# Patient Record
Sex: Female | Born: 1990 | Hispanic: Yes | Marital: Married | Smoking: Never smoker
Health system: Southern US, Community
[De-identification: ages and names within clinical notes are randomized; demographics above are authoritative.]

## PROBLEM LIST (undated history)

## (undated) DIAGNOSIS — D649 Anemia, unspecified: Secondary | ICD-10-CM

## (undated) HISTORY — PX: NO PAST SURGERIES: SHX2092

---

## 2011-10-12 NOTE — L&D Delivery Note (Signed)
     Julia Casey, Julia Casey [[id number]]  Delivery Note At 10:09 AM a non-viable female was delivered via Vaginal, Spontaneous Delivery (Presentation: Breech).  APGAR: 0, 0;  Placenta status: Delivered spontaneously.  Cord: 3 vessel.    Anesthesia: None  Episiotomy: None Lacerations: None Suture Repair: N/A Est. Blood Loss (mL): 600 ml   Mom to postpartum.  S/O and family members at bedside supporting patient.  Pt responding appropriately to loss.  LEFTWICH-KIRBY, Jayko Voorhees 11/11/2011, 12:16 PM     Julia Casey, Julia Casey [[id number]]  Delivery Note At 10:12 AM a non-viable female was delivered via Vaginal, Spontaneous Delivery (Presentation: Breech).  APGAR: 0, 0; Placenta status: delivered spontaneously.  Cord:  3 vessel.  Anesthesia: None  Episiotomy: None Lacerations: None Suture Repair: n/a Est. Blood Loss (mL): see above  Mom to postpartum.  S/O and family members at bedside supporting patient.  Pt responding appropriately to loss.  Dr Aviva Signs present for delivery of both infants.  LEFTWICH-KIRBY, Chevelle Durr 11/11/2011, 12:16 PM

## 2011-10-12 NOTE — L&D Delivery Note (Signed)
Delivery as documented.

## 2011-11-10 ENCOUNTER — Inpatient Hospital Stay (HOSPITAL_COMMUNITY)
Admission: AD | Admit: 2011-11-10 | Discharge: 2011-11-12 | DRG: 779 | Disposition: A | Discharge status: Home / Self Care | Payer: Medicaid - Out of State | Source: Ambulatory Visit | Attending: Obstetrics and Gynecology | Admitting: Obstetrics and Gynecology

## 2011-11-10 ENCOUNTER — Encounter (HOSPITAL_COMMUNITY): Payer: Self-pay | Admitting: *Deleted

## 2011-11-10 DIAGNOSIS — A749 Chlamydial infection, unspecified: Secondary | ICD-10-CM | POA: Diagnosis present

## 2011-11-10 DIAGNOSIS — O030 Genital tract and pelvic infection following incomplete spontaneous abortion: Principal | ICD-10-CM | POA: Diagnosis present

## 2011-11-10 DIAGNOSIS — IMO0002 Reserved for concepts with insufficient information to code with codable children: Secondary | ICD-10-CM | POA: Diagnosis present

## 2011-11-10 DIAGNOSIS — O3430 Maternal care for cervical incompetence, unspecified trimester: Secondary | ICD-10-CM | POA: Diagnosis present

## 2011-11-10 DIAGNOSIS — O30009 Twin pregnancy, unspecified number of placenta and unspecified number of amniotic sacs, unspecified trimester: Secondary | ICD-10-CM | POA: Diagnosis present

## 2011-11-10 HISTORY — DX: Anemia, unspecified: D64.9

## 2011-11-10 LAB — CBC
MCH: 23.1 pg — ABNORMAL LOW (ref 26.0–34.0)
MCHC: 31.9 g/dL (ref 30.0–36.0)
MCV: 72.3 fL — ABNORMAL LOW (ref 78.0–100.0)
Platelets: 253 10*3/uL (ref 150–400)
RDW: 18.1 % — ABNORMAL HIGH (ref 11.5–15.5)

## 2011-11-10 LAB — DIFFERENTIAL
Basophils Absolute: 0.1 10*3/uL (ref 0.0–0.1)
Basophils Relative: 0 % (ref 0–1)
Eosinophils Absolute: 0.2 10*3/uL (ref 0.0–0.7)
Eosinophils Relative: 2 % (ref 0–5)
Neutrophils Relative %: 65 % (ref 43–77)

## 2011-11-10 LAB — STREP B DNA PROBE

## 2011-11-10 NOTE — Progress Notes (Signed)
[name] found to have no FHT's  Via bedside US.

## 2011-11-10 NOTE — Progress Notes (Signed)
Pt presents to [facility] via ems.  Bulging membranes through vagina.  Pt states she started hurting around before calling ems.

## 2011-11-10 NOTE — H&P (Signed)
Julia Casey is a 21 y.o. female presenting for protruding membranes. Maternal Medical History:  Reason for admission: 66yr G3P2002 at estimated 18.4wks with twin gestation arrives via EMS with contractions and protruding membranes.  Pt states mild contractions, irregular, for approximately 1 day. Tonight, the patient was using the restroom at noticed her amniotic sac protruding through her vagina. She denies any fevers, leakage of fluid, discharge  Patient recently moved here from [place] and has limited prenatal care, consisting of an ultrasound suggesting twin gestation. Patient states that her EDD is "sometime in June". She denies any prior complications with this pregnancy.   Contractions: Onset was yesterday.   Frequency: irregular.      OB History    Grav Para Term Preterm Abortions TAB SAB Ect Mult Living   3 2 2  0 0 0 0 0 0 2     Past Medical History  Diagnosis Date  . Anemia    Past Surgical History  Procedure Date  . No past surgeries    Family History: family history is not on file. Social History:  reports that she has never smoked. She does not have any smokeless tobacco history on file. She reports that she uses illicit drugs (Marijuana). She reports that she does not drink alcohol.  Review of Systems  Constitutional: Negative for fever and chills.  Respiratory: Negative for shortness of breath.   Cardiovascular: Negative for chest pain.  Gastrointestinal: Negative for abdominal pain.  All other systems reviewed and are negative.      Blood pressure 121/68, pulse 96, temperature 98.2 F (36.8 C), temperature source Oral, resp. rate 18, height 5\' 3"  (1.6 m), weight 75.297 kg (166 lb). Maternal Exam:  Uterine Assessment: Contraction strength is mild.  Abdomen: Fundal height is 20cm.   Fetal presentation: breech  Introitus: Amniotic sac bulging out of vaginal introitus, intact.      Fetal Exam Fetal Monitor Review: Mode: ultrasound.   Baseline rate:  Fetal Heart tones not present in [name], Present in Baby B.      Physical Exam  Nursing note and vitals reviewed. Constitutional: She is oriented to person, place, and time. She appears well-developed and well-nourished. No distress.  HENT:  Head: Normocephalic and atraumatic.  GI: Soft. There is no tenderness.  Musculoskeletal: She exhibits no edema and no tenderness.  Neurological: She is alert and oriented to person, place, and time. No cranial nerve deficit.  Skin: Skin is warm and dry.  Psychiatric: She has a normal mood and affect. Her behavior is normal. Judgment and thought content normal.       Emotionally distressed     Prenatal labs: ABO, Rh:   Antibody:   Rubella:   RPR:    HBsAg:    HIV:    GBS:     Assessment/Plan: 1) Twin gestation with Fetal Demise and Living Fetus, <20wks  - approximately 18.4wks by US performed   - [name] IUFD, Baby B viable and breech presentation  - Suspect incompetent cervix at this time  - admit to L&D  - contrations increasing, will monitor progress of labor and anticipate augmentation if not progressing  - obtain routine prenatal labs  - routine care and expectant management   Cameron Proud 11/10/2011, 11:19 PM

## 2011-11-10 NOTE — Progress Notes (Signed)
SSE  Done per Leslie Dales, CNM.  Wet prep and cultures collected.  VE done.

## 2011-11-10 NOTE — Progress Notes (Signed)
Dr. Emelda Fear at bedside.  Bedside US done.

## 2011-11-10 NOTE — Progress Notes (Signed)
Pelvic exam done and labs collected after bag that was bulging through vagina appeared to be intact per CNM came out and contained nothing.

## 2011-11-11 ENCOUNTER — Other Ambulatory Visit: Payer: Self-pay | Admitting: Obstetrics and Gynecology

## 2011-11-11 ENCOUNTER — Encounter (HOSPITAL_COMMUNITY): Payer: Self-pay | Admitting: *Deleted

## 2011-11-11 ENCOUNTER — Inpatient Hospital Stay (HOSPITAL_COMMUNITY): Payer: Medicaid - Out of State

## 2011-11-11 DIAGNOSIS — O030 Genital tract and pelvic infection following incomplete spontaneous abortion: Secondary | ICD-10-CM

## 2011-11-11 DIAGNOSIS — O3430 Maternal care for cervical incompetence, unspecified trimester: Secondary | ICD-10-CM

## 2011-11-11 DIAGNOSIS — O30009 Twin pregnancy, unspecified number of placenta and unspecified number of amniotic sacs, unspecified trimester: Secondary | ICD-10-CM

## 2011-11-11 DIAGNOSIS — IMO0002 Reserved for concepts with insufficient information to code with codable children: Secondary | ICD-10-CM | POA: Diagnosis present

## 2011-11-11 LAB — CBC
HCT: 23.9 % — ABNORMAL LOW (ref 36.0–46.0)
HCT: 23.9 % — ABNORMAL LOW (ref 36.0–46.0)
Hemoglobin: 7.6 g/dL — ABNORMAL LOW (ref 12.0–15.0)
Hemoglobin: 8.1 g/dL — ABNORMAL LOW (ref 12.0–15.0)
MCH: 23 pg — ABNORMAL LOW (ref 26.0–34.0)
MCHC: 31.8 g/dL (ref 30.0–36.0)
MCV: 74.5 fL — ABNORMAL LOW (ref 78.0–100.0)
RDW: 17.1 % — ABNORMAL HIGH (ref 11.5–15.5)
WBC: 18 10*3/uL — ABNORMAL HIGH (ref 4.0–10.5)

## 2011-11-11 LAB — RUBELLA ANTIBODY, IGM: Rubella: IMMUNE

## 2011-11-11 LAB — GC/CHLAMYDIA PROBE AMP, GENITAL
Chlamydia, DNA Probe: POSITIVE — AB
GC Probe Amp, Genital: NEGATIVE

## 2011-11-11 LAB — RUBELLA SCREEN: Rubella: 24.7 IU/mL — ABNORMAL HIGH

## 2011-11-11 LAB — WET PREP, GENITAL
WBC, Wet Prep HPF POC: NONE SEEN
Yeast Wet Prep HPF POC: NONE SEEN

## 2011-11-11 LAB — HEPATITIS B SURFACE ANTIGEN: Hepatitis B Surface Ag: NEGATIVE

## 2011-11-11 LAB — RAPID HIV SCREEN (WH-MAU): Rapid HIV Screen: NONREACTIVE

## 2011-11-11 LAB — RAPID URINE DRUG SCREEN, HOSP PERFORMED
Opiates: NOT DETECTED
Tetrahydrocannabinol: NOT DETECTED

## 2011-11-11 LAB — ABO/RH: RH Type: POSITIVE

## 2011-11-11 MED ORDER — MISOPROSTOL 200 MCG PO TABS
400.0000 ug | ORAL_TABLET | Freq: Once | ORAL | Status: AC
  Administered 2011-11-11: 400 ug via VAGINAL
  Filled 2011-11-11: qty 2

## 2011-11-11 MED ORDER — MISOPROSTOL 200 MCG PO TABS
800.0000 ug | ORAL_TABLET | Freq: Once | ORAL | Status: AC
  Administered 2011-11-11: 800 ug via RECTAL

## 2011-11-11 MED ORDER — LACTATED RINGERS IV BOLUS (SEPSIS)
1000.0000 mL | Freq: Once | INTRAVENOUS | Status: AC
  Administered 2011-11-11: 1000 mL via INTRAVENOUS

## 2011-11-11 MED ORDER — IBUPROFEN 600 MG PO TABS
600.0000 mg | ORAL_TABLET | Freq: Four times a day (QID) | ORAL | Status: DC
  Administered 2011-11-11 – 2011-11-12 (×4): 600 mg via ORAL
  Filled 2011-11-11 (×4): qty 1

## 2011-11-11 MED ORDER — SIMETHICONE 80 MG PO CHEW: 80.0000 mg | CHEWABLE_TABLET | ORAL | Status: DC | PRN

## 2011-11-11 MED ORDER — ONDANSETRON HCL 4 MG/2ML IJ SOLN: 4.0000 mg | Freq: Four times a day (QID) | INTRAMUSCULAR | Status: DC | PRN

## 2011-11-11 MED ORDER — ONDANSETRON HCL 4 MG PO TABS: 4.0000 mg | ORAL_TABLET | ORAL | Status: DC | PRN

## 2011-11-11 MED ORDER — WITCH HAZEL-GLYCERIN EX PADS: 1.0000 "application " | MEDICATED_PAD | CUTANEOUS | Status: DC | PRN

## 2011-11-11 MED ORDER — BENZOCAINE-MENTHOL 20-0.5 % EX AERO: 1.0000 "application " | INHALATION_SPRAY | CUTANEOUS | Status: DC | PRN

## 2011-11-11 MED ORDER — ACETAMINOPHEN 325 MG PO TABS
650.0000 mg | ORAL_TABLET | ORAL | Status: DC | PRN
  Administered 2011-11-11: 650 mg via ORAL
  Filled 2011-11-11: qty 2

## 2011-11-11 MED ORDER — NALBUPHINE SYRINGE 5 MG/0.5 ML: 5.0000 mg | INJECTION | INTRAMUSCULAR | Status: DC | PRN

## 2011-11-11 MED ORDER — FLEET ENEMA 7-19 GM/118ML RE ENEM: 1.0000 | ENEMA | RECTAL | Status: DC | PRN

## 2011-11-11 MED ORDER — MISOPROSTOL 200 MCG PO TABS
ORAL_TABLET | ORAL | Status: AC
  Filled 2011-11-11: qty 4

## 2011-11-11 MED ORDER — IBUPROFEN 600 MG PO TABS
600.0000 mg | ORAL_TABLET | Freq: Four times a day (QID) | ORAL | Status: DC | PRN
  Administered 2011-11-11: 600 mg via ORAL
  Filled 2011-11-11: qty 1

## 2011-11-11 MED ORDER — DIBUCAINE 1 % RE OINT: 1.0000 "application " | TOPICAL_OINTMENT | RECTAL | Status: DC | PRN

## 2011-11-11 MED ORDER — FENTANYL CITRATE 0.05 MG/ML IJ SOLN: 100.0000 ug | INTRAMUSCULAR | Status: DC | PRN

## 2011-11-11 MED ORDER — LACTATED RINGERS IV SOLN
INTRAVENOUS | Status: DC
  Administered 2011-11-11 (×5): via INTRAVENOUS

## 2011-11-11 MED ORDER — LIDOCAINE HCL (PF) 1 % IJ SOLN: 30.0000 mL | INTRAMUSCULAR | Status: DC | PRN

## 2011-11-11 MED ORDER — SODIUM CHLORIDE 0.9 % IV SOLN
INTRAVENOUS | Status: DC
  Administered 2011-11-11: 12:00:00 via INTRAVENOUS

## 2011-11-11 MED ORDER — ZOLPIDEM TARTRATE 5 MG PO TABS: 5.0000 mg | ORAL_TABLET | Freq: Every evening | ORAL | Status: DC | PRN

## 2011-11-11 MED ORDER — OXYCODONE-ACETAMINOPHEN 5-325 MG PO TABS: 1.0000 | ORAL_TABLET | ORAL | Status: DC | PRN

## 2011-11-11 MED ORDER — LANOLIN HYDROUS EX OINT: TOPICAL_OINTMENT | CUTANEOUS | Status: DC | PRN

## 2011-11-11 MED ORDER — SENNOSIDES-DOCUSATE SODIUM 8.6-50 MG PO TABS
2.0000 | ORAL_TABLET | Freq: Every day | ORAL | Status: DC
  Administered 2011-11-11: 2 via ORAL

## 2011-11-11 MED ORDER — OXYTOCIN 20 UNITS IN LACTATED RINGERS INFUSION - SIMPLE
125.0000 mL/h | Freq: Once | INTRAVENOUS | Status: AC
  Administered 2011-11-11: 125 mL/h via INTRAVENOUS

## 2011-11-11 MED ORDER — CITRIC ACID-SODIUM CITRATE 334-500 MG/5ML PO SOLN: 30.0000 mL | ORAL | Status: DC | PRN

## 2011-11-11 MED ORDER — OXYCODONE-ACETAMINOPHEN 5-325 MG PO TABS
1.0000 | ORAL_TABLET | ORAL | Status: DC | PRN
  Administered 2011-11-11: 1 via ORAL
  Filled 2011-11-11: qty 1

## 2011-11-11 MED ORDER — ONDANSETRON HCL 4 MG/2ML IJ SOLN: 4.0000 mg | INTRAMUSCULAR | Status: DC | PRN

## 2011-11-11 MED ORDER — PRENATAL MULTIVITAMIN CH
1.0000 | ORAL_TABLET | Freq: Every day | ORAL | Status: DC
  Administered 2011-11-11 – 2011-11-12 (×2): 1 via ORAL
  Filled 2011-11-11 (×2): qty 1

## 2011-11-11 MED ORDER — DIPHENHYDRAMINE HCL 25 MG PO CAPS: 25.0000 mg | ORAL_CAPSULE | Freq: Four times a day (QID) | ORAL | Status: DC | PRN

## 2011-11-11 MED ORDER — TETANUS-DIPHTH-ACELL PERTUSSIS 5-2.5-18.5 LF-MCG/0.5 IM SUSP
0.5000 mL | Freq: Once | INTRAMUSCULAR | Status: DC
  Filled 2011-11-11: qty 0.5

## 2011-11-11 MED ORDER — LACTATED RINGERS IV SOLN
500.0000 mL | INTRAVENOUS | Status: DC | PRN
  Administered 2011-11-11: 1000 mL via INTRAVENOUS

## 2011-11-11 MED ORDER — OXYTOCIN BOLUS FROM INFUSION
500.0000 mL | Freq: Once | INTRAVENOUS | Status: DC
  Filled 2011-11-11: qty 1000
  Filled 2011-11-11: qty 500
  Filled 2011-11-11: qty 1000

## 2011-11-11 NOTE — Progress Notes (Signed)
Patients situation has been discussed with Dr Lily Peer, Attending at [facility] L&D tonight. Dr Lily Peer agreed to review the records, and the ultrasound, and a conference call was arranged thru the Recorded lines of [facility], with both Dr Lily Peer and the Neonatologist speaking to the family in their room via speakerphone. The family's (Partner "Sam" and the patient's mother, and 2 others) questions were answered and at the end of the conference call the patient and family were accepting of the care being received here.  Dr Lily Peer had described the pregnancy loss as a Miscarriage, and the unfortunate inevitability of pregnancy loss is apparently accepted by the patient and family at this time.  At present the patient's contractions have eased off, and if no progress toward completed miscarriage is achieved in the next 2 hours, the importance of using medications to accelerate delivery is explained to patient and family, and they are accepting of this plan at this time. Patient remains afebrile, with scant bleeding at present.

## 2011-11-11 NOTE — Progress Notes (Signed)
After speaking with pt and family, they are still requesting a transfer to another hospital, dr Emelda Fear notified and says he will make some calls to see if he can fulfill pts request

## 2011-11-11 NOTE — Progress Notes (Signed)
Julia Casey is a 21 y.o. [id number] at [redacted]w[redacted]d  by ultrasound admitted for previable preterm labor of twins.  Subjective: Pt doing well. Reports intermittent cramping pain at this time. Family members at bedside for support.  Objective: BP 107/63  Pulse 100  Temp(Src) 98.4 F (36.9 C) (Axillary)  Resp 18  Ht 5\' 3"  (1.6 m)  Wt 75.297 kg (166 lb)  BMI 29.41 kg/m2  SpO2 98%      FHT:  n/a UC:   Pt reports irregular cramping SVE:   Deferred at this time  Labs: Lab Results  Component Value Date   WBC 16.7* 11/11/2011   HGB 7.6* 11/11/2011   HCT 23.9* 11/11/2011   MCV 72.4* 11/11/2011   PLT 260 11/11/2011    Assessment / Plan: Augmentation of labor, progressing well Last dose of cytotec at 0630  Labor: Progressing normally Preeclampsia:  n/a Fetal Wellbeing:  n/a Pain Control:  Labor support without medications I/D:  n/a Anticipated MOD:  NSVD  LEFTWICH-KIRBY, Jamere Stidham 11/11/2011, 12:36 PM

## 2011-11-11 NOTE — Progress Notes (Signed)
11/11/11 1600  Clinical Encounter Type  Visited With Patient and family together (FOB present.)  Visit Type Follow-up  Referral From Chaplain  Spiritual Encounters  Spiritual Needs Grief support;Emotional    Per referral from [facility], followed up with pt and FOB to offer pastoral presence.  Pt was very quiet.  FOB served as strong support and advocate, and stated that he has not slept in 30 hours.  Encouraged rest and self-care.    Family is aware of ongoing chaplain availability but currently needs rest and private time.  Avis Epley, [place] Chaplain [phone]

## 2011-11-11 NOTE — Progress Notes (Signed)
Dr Wynetta Fines at bedside for maternal bleeding, and BPs. Updated on interventions provided

## 2011-11-11 NOTE — Progress Notes (Signed)
Called to see pt along with Cathie Beams for heavy vaginal bleeding and syncope.  Pt has passed 2 separate clots approx 250 cc each, as well as some free blood, and has become syncopal with low bp, has responded to 1000 cc LR fluid bolus .  No further bleeding at present Type and screen ordered. Will monitor closely

## 2011-11-11 NOTE — H&P (Signed)
  Pt was being prepped to try to obtain cultures/insert Foley.  Membranes were noted to be nearly out of introitus, still intact.  Pt asked to bear down.  An intact bag of water, about the size of a grapefruit, proceeded to come out of the vagina.  It was completely separated from her body, intact, and empty except for amniotic fluid.  A SSE was performed and membranes visible.  Sterile digital exam reveals membranes at +1 station during a contraction, and no palpable cervix.  Dr. Emelda Fear notified of above and ordered a complete bedside ultrasound.  Pt appears to be contracting every 3 minutes or so.  Declines IV pain meds.  ATTENDING NOTE: Patient examined this PM and bedside ultrasound witnessed.  Previously I performed a brief ultrasound in [facility]U upon arrival here, and at that time [name] was FDIU, presenting part breech with buttocks at cervix, with posterior low lying placenta. Since then the patient has begun to contract actively, and have some bloody show.  Presentation now appears compound , with extremity , lower leg, of baby B appearing to be in the cervix also. Fetal bradycardia noted with baby B now also. Ultrasound unable to definitely discern or rule out abruption of fundal placenta. IMpresssion;  INevitable abortion of twin gestation 18-19 weeks , presenting with active labor. Preterm labor , unable to determine if abruption present. Demise of [name]. Plan: allow to labor, IV fentanyl for support, anticipate vaginal delivery of twins. UDS ordered, and prenatal labs obtained.  Supportive partneer at bedside.

## 2011-11-11 NOTE — Progress Notes (Signed)
Called to check pt due to rectal pressure.  [place] bulging at introitus. AROM releasing generous clear fluid, followed by some bright blood.  Cervix 3 cm 50% with fetal small pars in cervix.  Will place cytotec 400 micrograms again at this time.

## 2011-11-11 NOTE — Progress Notes (Signed)
After looking at [place], dr Emelda Fear explains to pt that her body is rejecting the pregnancy and that at this time he does not recommend trying to stop her labor, pt and her husband are accepting of this but are behaving appropriately for the situation, plan is to just watch pt at this time

## 2011-11-11 NOTE — Progress Notes (Signed)
Pt asking questions about if we will resuscitate infants if they are born with a heart rate, rn explains that with babies this gestation there would be nothing we could do, fob calls rn back into room and says they would like to be transferred to a facility where their babies can be resuscitated, cnm notified

## 2011-11-11 NOTE — Progress Notes (Signed)
Pt now more responsive, cnm says babies are ready to deliver but that we will give pt time to stabilize before pushing

## 2011-11-11 NOTE — Progress Notes (Signed)
Pt feeling faint, o2 placed on pt at 10l/min, lr bolus cont to run, pt pale and diaphoretic, becomes unresponsive briefy but responses to sternal rub cnm called to come to room

## 2011-11-11 NOTE — Progress Notes (Signed)
Dr Emelda Fear notified of bleeding after rupture of forebag, md says to keep an eye on bleeding and he will crossmatch pt if he needs to

## 2011-11-11 NOTE — Progress Notes (Signed)
Made initial contact with pt this morning when I received report from over-night chaplain that she was in labor and that the fetal heartbeat was not detected.  Was called back by physician after delivery.  Pt requested prayer for the babies.  I offered prayer as well as emotional support and presence.  Please page as needed for follow-up.  [phone]  Thornell Sartorius Chaplain 12:22 PM   11/11/11 1200  Clinical Encounter Type  Visited With Patient and family together  Visit Type Spiritual support;Death  Referral From Physician  Spiritual Encounters  Spiritual Needs Grief support;Prayer

## 2011-11-11 NOTE — Progress Notes (Signed)
Pt taken to room 161 in stable condition via stretcher in supine position.

## 2011-11-11 NOTE — Progress Notes (Signed)
I have spoken again with the family who have questions about whether the babies could have a chance to survive at an other facility, such as [facility].   The family has had one child survive premature birth, one that weighed around 2 pounds.  That is their point of reference. I have tried to explain that these infants are far smaller than that.    Patient's husband has questions still, and want another opinion.  Dr Eric Form has agreed to review the record, and the ultrasound, which the family viewed as it was performed, and speak with the family.

## 2011-11-11 NOTE — Progress Notes (Signed)
rn returns to room to find pt throwing up, rn cleans pt up and pt reports feeling big gush of blood while throwing up, upon assessment pt has passed another large clot, charge rn called to get some help to room 300cc lr bolus started

## 2011-11-11 NOTE — Consult Note (Signed)
Asked by Dr. Emelda Fear to provide prenatal consultation for patient at 35 - [redacted] wks EGA with twins, preterm labor, and oligohydramnios of twin A (who apparently is asystolic per ultrasound).  Father of babies states he has a nephew and a niece who were born at 29  - 40 wks, both of whom survived and are now normal healthy children at 53 and 20 years of age.  I spoke to patient and FOB in the presence of an older couple (presumably grandparents).  They were Hispanic and spoke [place].  The FOB was the primary spokesman and was fluent, but at my request an interpreter was present during the conversation.  I explained that there had never been a survival at < 20 wks, that there must have been a misunderstanding, miscommunication about the father's nephew and niece, possibly that their physicians had erroneously underestimated the GA for those infants.  I showed them how big their twins were (based on BPD of 4 cm as the ultrasound showed) and told them that they would be too small for even our smallest tubes, IVs and support apparatus.  I also emphasized the overall immaturity of the organ systems, especially the skin.  I told them no resuscitation would be undertaken due the futility of resuscitation at this GA.  Despite this the FOB was adamant about their desire for intervention on behalf of twins (both maternal Rx to attempt to delay delivery, and postnatal resuscitation of infants).  It was unclear to me whether or not they understand that twin A has already expired.  When I told them no neonatal resuscitation would be attempted FOB requested transfer to another institution.  Throughout the conversation the FOB was calm and respectful.  I subsequently informed Dr. Emelda Fear of the above and he plans to consult with MFM at [facility] and request transfer to accommodate the family's wishes.  Thank you for the consultation.  Savas Elvin E. Barrie Dunker., MD

## 2011-11-11 NOTE — Progress Notes (Signed)
Pt in and out of consciousness, dr Emelda Fear and cnm now at bedside assessing pt, md shown pads with pt blood loss on them

## 2011-11-12 DIAGNOSIS — A749 Chlamydial infection, unspecified: Secondary | ICD-10-CM | POA: Diagnosis present

## 2011-11-12 LAB — TYPE AND SCREEN
ABO/RH(D): O POS
Unit division: 0

## 2011-11-12 MED ORDER — FERROUS SULFATE 325 (65 FE) MG PO TABS: 325.0000 mg | ORAL_TABLET | Freq: Two times a day (BID) | ORAL | Status: AC

## 2011-11-12 MED ORDER — IBUPROFEN 600 MG PO TABS: 600.0000 mg | ORAL_TABLET | Freq: Four times a day (QID) | ORAL | Status: AC | PRN

## 2011-11-12 MED ORDER — AZITHROMYCIN 500 MG PO TABS
1000.0000 mg | ORAL_TABLET | Freq: Once | ORAL | Status: AC
  Administered 2011-11-12: 1000 mg via ORAL
  Filled 2011-11-12: qty 2

## 2011-11-12 NOTE — Progress Notes (Signed)
Post Partum Day 1 Subjective: no complaints, up ad lib, voiding, tolerating PO and + flatus.  Grieving appropriately  Objective: Blood pressure 101/69, pulse 84, temperature 97.6 F (36.4 C), temperature source Oral, resp. rate 18, height 5\' 3"  (1.6 m), weight 75.297 kg (166 lb), SpO2 100.00%.  Physical Exam:  General: alert and no distress Lochia: appropriate Uterine Fundus: firm, NT DVT Evaluation: No evidence of DVT seen on physical exam. Negative Homan's sign.   Basename 11/11/11 1930 11/11/11 0850  HGB 8.1* 7.6*  HCT 23.9* 23.9*    Assessment/Plan: Discharge home Contraception Implanon Follow up in GYN clinic   LOS: 2 days   Bryce Cheever A 11/12/2011, 9:46 AM

## 2011-11-12 NOTE — Progress Notes (Signed)
Followed up with pt and father of babies.  Father seemed distracted and not interested in talking further.  Pt seemed quiet and did not wish to talk further.  I reminded her about the resource packet and about the support groups available to her through [facility].  I also asked her about her resources for support at home and she said that she had support from family and from her church community.    Thornell Sartorius Chaplain Pager, [phone] 10:51 AM   11/12/11 1000  Clinical Encounter Type  Visited With Patient and family together  Visit Type Follow-up  Spiritual Encounters  Spiritual Needs Emotional;Grief support

## 2011-11-12 NOTE — Progress Notes (Signed)
Pt discharged to home with significant other.  Condition stable.  Pt ambulated to car with E. Pinion, NT.  No equipment ordered for home at time of discharge.

## 2011-11-12 NOTE — Progress Notes (Signed)
UR Chart review completed.  

## 2011-11-12 NOTE — Discharge Summary (Signed)
Obstetric Discharge Summary Reason for Admission: onset of labor at [redacted] weeks GA Prenatal Procedures: ultrasound Intrapartum Procedures: spontaneous vaginal delivery Postpartum Procedures: transfusion  of 2 units of pRBCs due to PPH Complications-Operative and Postpartum: hemorrhage Hemoglobin  Date Value Range Status  11/11/2011 8.1* 12.0-15.0 (g/dL) Final     HCT  Date Value Range Status  11/11/2011 23.9* 36.0-46.0 (%) Final    Discharge Diagnoses: Incompetent cervix and twin gestation, chlamydia infection (treated)  Brief Hospital Course: Patient came in with inevitable miscarriage of twin pregnancy at [redacted]w[redacted]d.  History of two prior 36 week deliveries, no PNC.  She went on to have a SVD complicated by PPH needing transfusion of two units pRBCs.  Discharge Hgb 8.1, she as started on iron therapy.  No further complications. Appropriate support given to patient.  Labs showed +chlamydia, patient treated before discharge and partner evaluation/treatment recommended.  Patient was informed of need for early [facility] during her next pregnancy; will need 17P injection =/- cerclage and cervical length surveillance. Follow up appointment scheduled in clinic on 12/02/11.  Newborn Data:   Gypsy, Kellogg [[id number]]  Live born female  Birth Weight: 6.6 oz (187 g) APGAR: 0, 0   Pachia, Strum [[id number]]  Live born female  Birth Weight: 7.2 oz (204 g) APGAR: 0, 0  Discharge Information: Date: 11/12/2011 Activity: pelvic rest and as instructed Diet: routine Medications: Ibuprofen and Iron Condition: stable Instructions: As per discharge handout Discharge to: home Follow-up Information    Follow up with [facility] on 12/02/2011. (3pm with Maylon Cos)    Contact information:   [address] [place] [id number] [phone]          Syria Kestner A 11/12/2011, 9:57 AM

## 2011-11-12 NOTE — H&P (Signed)
Patient seen in coordination with Dr Garen Lah. Patient admitted to L&D for management. I agree with documentation

## 2011-11-13 LAB — CULTURE, BETA STREP (GROUP B ONLY)

## 2011-12-02 ENCOUNTER — Ambulatory Visit (INDEPENDENT_AMBULATORY_CARE_PROVIDER_SITE_OTHER): Payer: Medicaid - Out of State | Admitting: Physician Assistant

## 2011-12-02 ENCOUNTER — Encounter: Payer: Self-pay | Admitting: Physician Assistant

## 2011-12-02 VITALS — BP 114/74 | HR 85 | Temp 98.0°F | Ht 63.0 in | Wt 170.6 lb

## 2011-12-02 DIAGNOSIS — O021 Missed abortion: Secondary | ICD-10-CM

## 2011-12-02 DIAGNOSIS — O039 Complete or unspecified spontaneous abortion without complication: Secondary | ICD-10-CM

## 2011-12-02 NOTE — Progress Notes (Signed)
  Subjective:     Julia Casey is a 21 y.o. female who presents for a postpartum visit. She is 3 weeks postpartum following a spontaneous vaginal delivery of twin gestation at 24 weeks. I have fully reviewed the prenatal and intrapartum course. Patient underwent pRBC transfusion of 2 units for anemia of blood loss and was treated for chlamydia+. The delivery was at 19 gestational weeks. Outcome: spontaneous abortion and preterm labor. Anesthesia: none. Postpartum course has been uncomplicated.   Bleeding staining only. Bowel function is normal. Bladder function is normal. Patient is not sexually active. Contraception method is none and patient desires nexplanon and condoms until available.. Postpartum depression screening: negative.  The following portions of the patient's history were reviewed and updated as appropriate: allergies, current medications, past family history, past medical history, past social history, past surgical history and problem list.  Review of Systems Pertinent items are noted in HPI.   Objective:    BP 114/74  Pulse 85  Temp 98 F (36.7 C)  Ht 5\' 3"  (1.6 m)  Wt 170 lb 9.6 oz (77.384 kg)  BMI 30.22 kg/m2  General:  alert and cooperative   Breasts:  inot performed  Lungs: clear to auscultation bilaterally  Heart:  regular rate and rhythm, S1, S2 normal, no murmur, click, rub or gallop  Abdomen: soft, non-tender; bowel sounds normal; no masses,  no organomegaly   Vulva:  not evaluated  Vagina: not evaluated        Assessment:    Normal postpartum exam after early pregnancy loss/preterm labor twin gestation. Pap smear not done at today's visit.   Plan:    1. Contraception: condoms and will call when nexplanon is available 2. Blood loss anemia. Continue iron and check CBC today. 3. Follow up in: a few weeks for nexplanon or as needed.  4. Stressed importance of early prenatal care and planning if desired in future-will need 17-P for preterm labor. Check  chlamydia and TOC today. Advised partner be treated and avoid sex x 7 days after treatment.

## 2011-12-02 NOTE — Patient Instructions (Signed)
Birth Control Choices Birth control is the use of any practices, methods, or devices to prevent pregnancy from happening in a sexually active woman.  Below are some birth control choices to help avoid pregnancy.  Not having sex (abstinence) is the surest form of birth control. This requires self-control. There is no risk of acquiring a sexually transmitted disease (STD), including acquired immunodeficiency syndrome (AIDS).   Periodic abstinence requires self-control during certain times of the month.   Calendar method, timing your menstrual periods from month to month.   Ovulation method is avoiding sexual intercourse around the time you produce an egg (ovulate).   Symptotherm method is avoiding sexual intercourse at the time of ovulation, using a thermometer and ovulation symptoms.   Post ovulation method is the timing of sexual intercourse after you ovulated.  These methods do not protect against STDs, including AIDS.  Birth control pills (BCPs) contain estrogen and progesterone hormone. These medicines work by stopping the egg from forming in the ovary (ovulation). Birth control pills are prescribed by a caregiver who will ask you questions about the risks of taking BCPs. Birth control pills do not protect against STDs, including AIDS.   "Minipill" birth control pills have only the progesterone hormone. They are taken every day of each month and must be prescribed by your caregiver. They do not protect against STDs, including AIDS.   Emergency contraception is often call the "morning after" pill. This pill can be taken right after sex or up to five days after sex if you think your birth control failed, you failed to use contraception, or you were forced to have sex. It is most effective the sooner you take the pills after having sexual intercourse. Do not use emergency contraception as your only form of birth control. Emergency contraceptive pills are available without a prescription. Check  with your pharmacist.   Condoms are a thin sheath of latex, synthetic material, or lambskin worn over the penis during sexual intercourse. They can have a spermicide in or on them when you buy them. Latex condoms can prevent pregnancy and STDs. "Natural" or lambskin condoms can prevent pregnancy but may not protect against STDs, including AIDS.   Female condoms are a soft, loose-fitting sheath that is put into the vagina before sexual intercourse. They can prevent pregnancy and STDs, including AIDS.   Sponge is a soft, circular piece of polyurethane foam with spermicide in it that is inserted into the vagina after wetting it and before sexual intercourse. It does not require a prescription from your caregiver. It does not protect against STDs, including AIDS.   Diaphragm is a soft, latex, dome-shaped barrier that must be fitted by a caregiver. It is inserted into the vagina, along with a spermicidal jelly. After the proper fitting for a diaphragm, always insert the diaphragm before intercourse. The diaphragm should be left in the vagina for 6 to 8 hours after intercourse. Removal and reinsertion with a spermicide is always necessary after any use. It does not protect against STDs, including AIDS.   Progesterone-only injections are given every 3 months to prevent pregnancy. These injections contain synthetic progesterone and no estrogen. This hormone stops the ovaries from releasing eggs. It also causes the cervical mucus to thicken and changes the uterine lining. This makes it harder for sperm to survive in the uterus. It does not protect against STDs, including AIDS.   Birth Control Patch contains hormones similar to those in birth control pills, so effectiveness, risks, and side effects   are similar. It must be changed once a week and is prescribed by a caregiver. It is less effective in very overweight women. It does not protect against STDs, including AIDS.   Vaginal Ring contains hormones similar  to those in birth control pills. It is left in place for 3 weeks, removed for 1 week, and then a new one is put back into the vagina. It comes with a timer to put in your purse to help you remember when to take it out or put a new one in. A caregiver's examination and prescription is necessary, just like with birth control pills and the patch. It does not protect against STDs, including AIDS.   Estrogen plus progesterone injections are given every 28 to 30 days. They can be given in the upper arm, thigh, or buttocks. It does not protect against STDs, including AIDS.   Intrauterine device (IUD): copper T or progestin filled is a T-shaped device that is put in a woman's uterus during a menstrual period to prevent pregnancy. The copper T IUD can last 10 years, and the progestin IUD can last 5 years. The progestin IUD can also help control heavy menstrual periods. It does not protect against STDs, including AIDS. The copper T IUD can be used as emergency contraception if inserted within 5 days of having unprotected intercourse.   Cervical cap is a round, soft latex or plastic cup that fits over the cervix and must be fitted by a caregiver. You do not need to use a spermicide with it or remove and insert it every time you have sexual intercourse. It does not protect against STDs, including AIDS.   Spermicides are chemicals that kill or block sperm from entering the cervix and uterus. They come in the form of creams, jellies, suppositories, foam, or tablets, and they do not require a prescription. They are inserted into the vagina with an applicator before having sexual intercourse. This must be repeated every time you have sexual intercourse.   Withdrawal is using the method of the female withdrawing his penis from sexual intercourse before he has a climax and deposits his sperm. It does not protect against STDs, including AIDS.   Female tubal ligation is when the woman's fallopian tubes are surgically sealed  or tied to prevent the egg from traveling to the uterus. It does not protect against STDs, including AIDS.   Female sterilization is when the female has his tubes that carry sperm tied off (vasectomy) to stop sperm from entering the vagina during sexual intercourse. It does not protect against STDs, including AIDS.  Regardless of which method of birth control you choose, it is still important that you use some form of protection against STDs. Document Released: 09/27/2005 Document Revised: 10/30/2010 Document Reviewed: 08/14/2009 [facility] Patient Information 2012 [place], [place].Safer Sex Your caregiver wants you to have this information about the infections that can be transmitted from sexual contact and how to prevent them. The idea behind safer sex is that you can be sexually active, and at the same time reduce the risk of giving or getting a sexually transmitted disease (STD). Every person should be aware of how to prevent him or herself and his or her sex partner from getting an STD. CAUSES OF STDS STDs are transmitted by sharing body fluids, which contain viruses and bacteria. The following fluids all transmit infections during sexual intercourse and sex acts:  Semen.   Saliva.   Urine.   Blood.   Vaginal mucus.  Examples  of STDs include:  Chlamydia.   Gonorrhea.   Genital herpes.   Hepatitis B.   Human immunodeficiency virus or acquired immunodeficiency syndrome (HIV or AIDS).   Syphilis.   Trichomonas.   Pubic lice.   Human papillomavirus (HPV), which may include:   Genital warts.   Cervical dysplasia.   Cervical cancer (can develop with certain types of HPV).  SYMPTOMS  Sexual diseases often cause few or no symptoms until they are advanced, so a person can be infected and spread the infection without knowing it. Some STDs respond to treatment very well. Others, like HIV and herpes, cannot be cured, but are treated to reduce their effects. Specific symptoms  include:  Abnormal vaginal discharge.   Irritation or itching in and around the vagina, and in the pubic hair.   Pain during sexual intercourse.   Bleeding during sexual intercourse.   Pelvic or abdominal pain.   Fever.   Growths in and around the vagina.   An ulcer in or around the vagina.   Swollen glands in the groin area.  DIAGNOSIS   Blood tests.   Pap test.   Culture test of abnormal vaginal discharge.   A test that applies a solution and examines the cervix with a lighted magnifying scope (colposcopy).   A test that examines the pelvis with a lighted tube, through a small incision (laparoscopy).  TREATMENT  The treatment will depend on the cause of the STD.  Antibiotic treatment by injection, oral, creams, or suppositories in the vagina.   Over-the-counter medicated shampoo, to get rid of pubic lice.   Removing or treating growths with medicine, freezing, burning (electrocautery), or surgery.   Surgery treatment for HPV of the cervix.   Supportive medicines for herpes, HIV, AIDS, and hepatitis.  Being careful cannot eliminate all risk of infection, but sex can be made much safer. Safe sexual practices include body massage and gentle touching. Masturbation is safe, as long as body fluids do not contact skin that has sores or cuts. Dry kissing and oral sex on a man wearing a latex condom or on a woman wearing a female condom is also safe. Slightly less safe is intercourse while the man wears a latex condom or wet kissing. It is also safer to have one sex partner that you know is not having sex with anyone else. LENGTH OF ILLNESS An STD might be treated and cured in a week, sometimes a month, or more. And it can linger with symptoms for many years. STDs can also cause damage to the female organs. This can cause chronic pain, infertility, and recurrence of the STD, especially herpes, hepatitis, HIV, and HPV. HOME CARE INSTRUCTIONS AND PREVENTION  Alcohol and  recreational drugs are often the reason given for not practicing safer sex. These substances affect your judgment. Alcohol and recreational drugs can also impair your immune system, making you more vulnerable to disease.   Do not engage in risky and dangerous sexual practices, including:   Vaginal or anal sex without a condom.   Oral sex on a man without a condom.   Oral sex on a woman without a female condom.   Using saliva to lubricate a condom.   Any other sexual contact in which body fluids or blood from one partner contact the other partner.   You should use only latex condoms for men and water soluble lubricants. Petroleum based lubricants or oils used to lubricate a condom will weaken the condom and increase the chance  that it will break.   Think very carefully before having sex with anyone who is high risk for STDs and HIV. This includes IV drug users, people with multiple sexual partners, or people who have had an STD, or a positive hepatitis or HIV blood test.   Remember that even if your partner has had only one previous partner, their previous partner might have had multiple partners. If so, you are at high risk of being exposed to an STD. You and your sex partner should be the only sex partners with each other, with no one else involved.   A vaccine is available for hepatitis B and HPV through your caregiver or the Public Health Department. Everyone should be vaccinated with these vaccines.   Avoid risky sex practices. Sex acts that can break the skin make you more likely to get an STD.  SEEK MEDICAL CARE IF:   If you think you have an STD, even if you do not have any symptoms. Contact your caregiver for evaluation and treatment, if needed.   You think or know your sex partner has acquired an STD.   You have any of the symptoms mentioned above.  Document Released: 11/04/2004 Document Revised: 06/09/2011 Document Reviewed: 08/27/2009 [facility] Patient Information 2012  [place], [place].

## 2011-12-02 NOTE — Progress Notes (Signed)
I have seen/examined this patient and agree with the resident's assessment and plan. Additionally, referral to [facility] given. Grieving appropriately

## 2011-12-03 LAB — CBC
HCT: 27.3 % — ABNORMAL LOW (ref 36.0–46.0)
Hemoglobin: 7.9 g/dL — ABNORMAL LOW (ref 12.0–15.0)
MCH: 21.8 pg — ABNORMAL LOW (ref 26.0–34.0)
MCHC: 28.9 g/dL — ABNORMAL LOW (ref 30.0–36.0)
MCV: 75.4 fL — ABNORMAL LOW (ref 78.0–100.0)

## 2012-05-30 IMAGING — US US OB COMP +14 WK
1 series · 12 of 23 positions shown · non-contrast
Comparison: none

[Series 1: us ob comp +14 wk · 23 acquisitions, 12 frames shown]
[im 1/23]
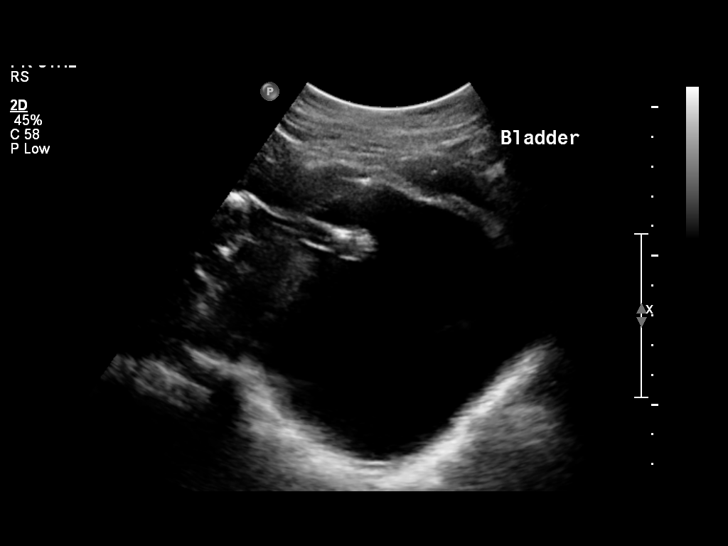
[im 3/23]
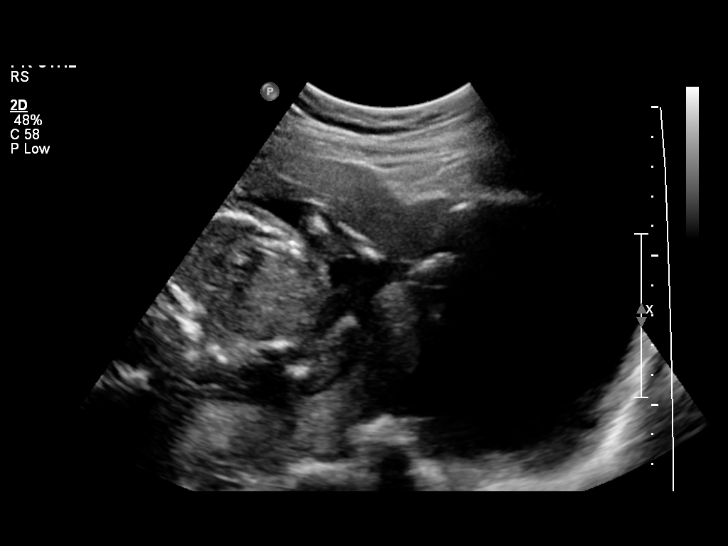
[im 5/23]
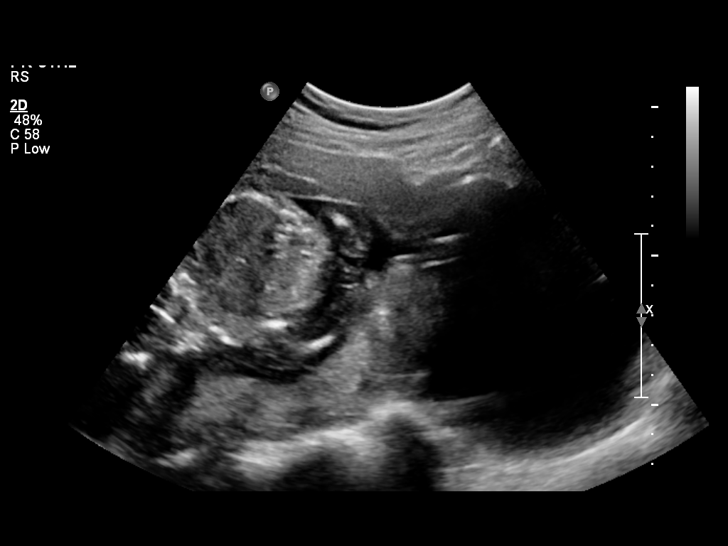
[im 7/23]
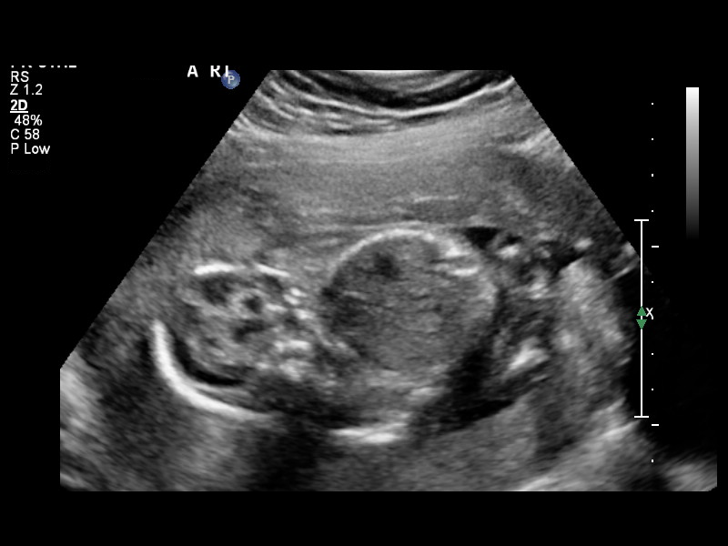
[im 9/23]
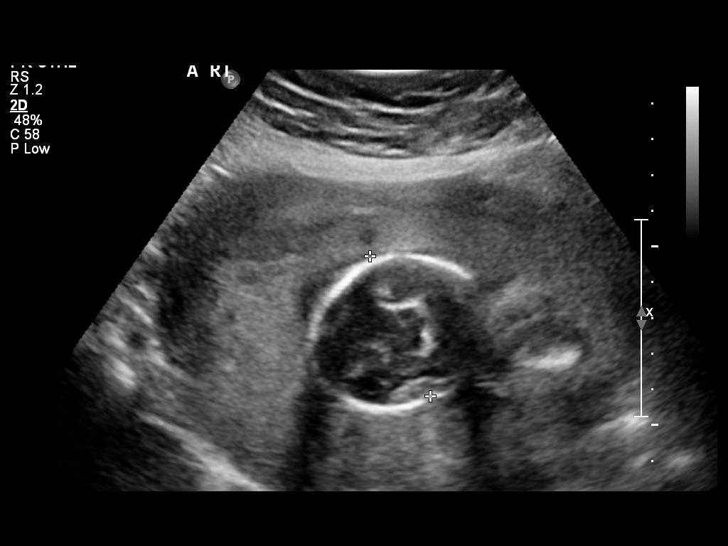
[im 11/23]
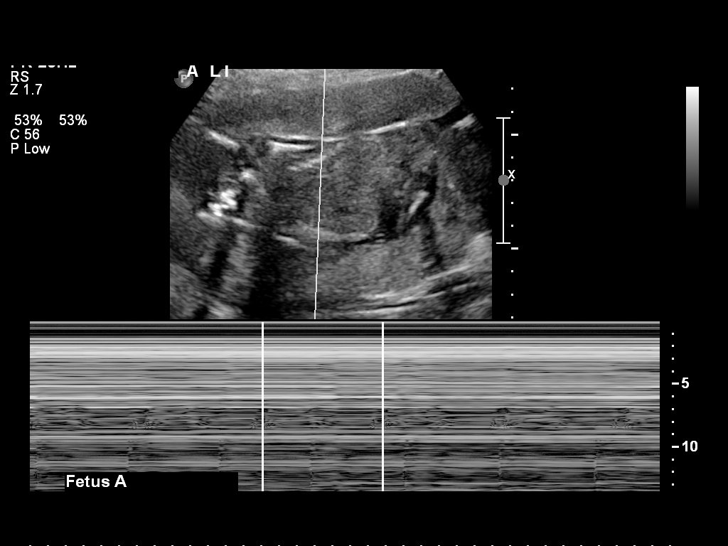
[im 13/23]
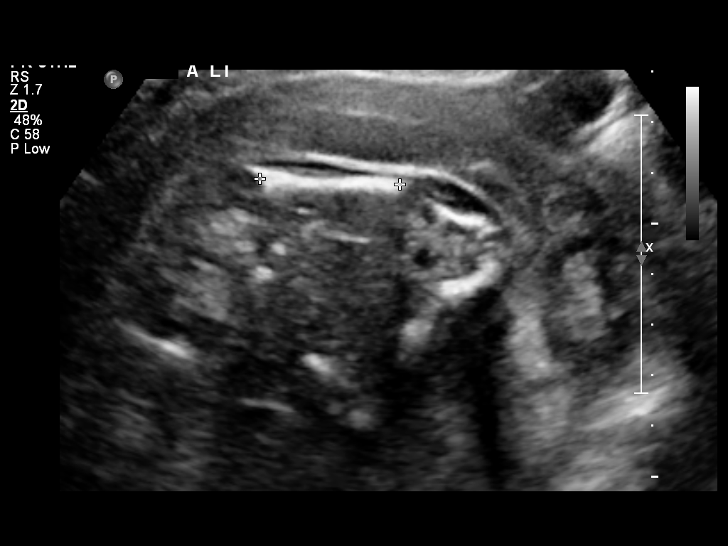
[im 15/23]
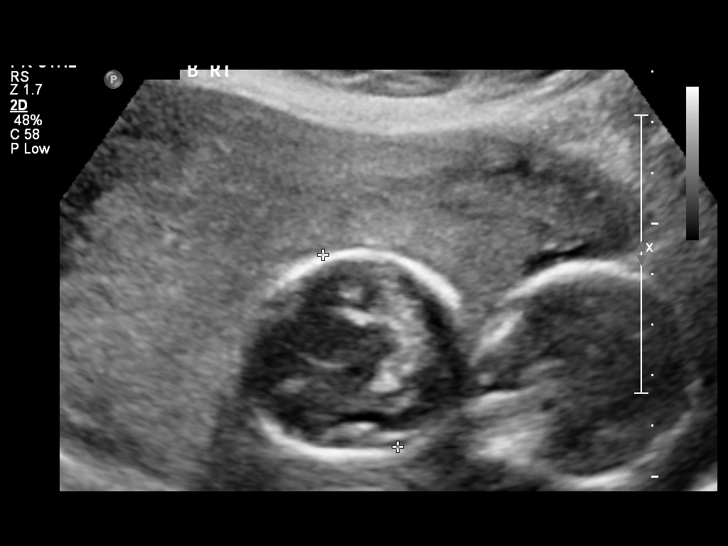
[im 17/23]
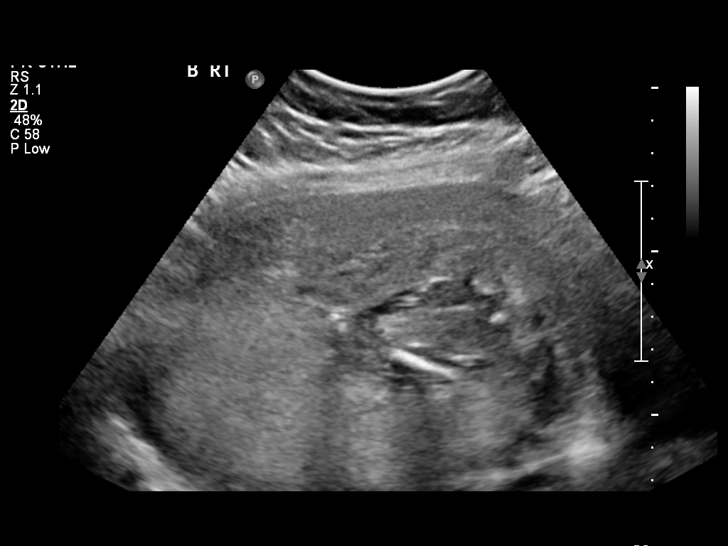
[im 19/23]
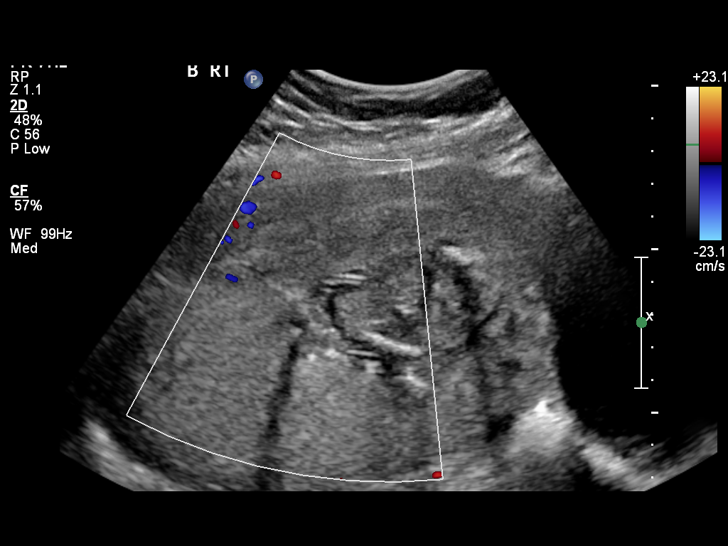
[im 21/23]
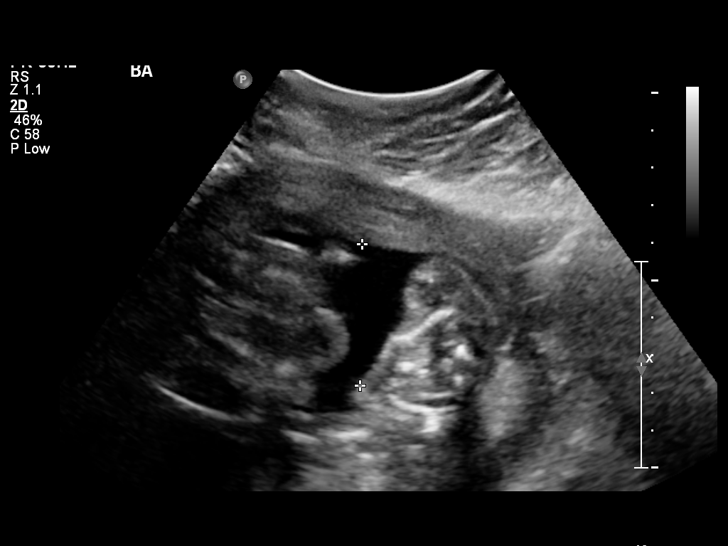
[im 23/23]
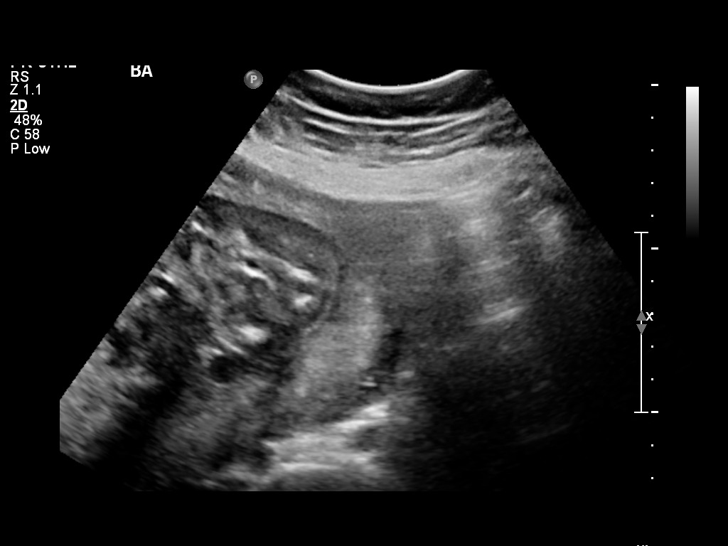

[12 of 23 positions shown; findings below may reference images not displayed]

OBSTETRICS REPORT
                      (Signed Final 11/11/2011 [DATE])

                 Neena CNM
 Order#:         [id number]
Procedures

 US OB COMP + 14 WK                                    76805.1
Indications

 Twin gestation
 Pain - Abdominal/Pelvic
 Bulging membranes
 Twin gestation, demise of 1 fetus
Fetal Evaluation (Fetus A)

 Fetal Heart Rate:  89                          bpm
 Cardiac Activity:  Bradycardia
 Fetal Lie:         Maternal left side
 Presentation:      Breech, lower extremity
                    presenting part
 Placenta:          Posterior Fundal, above
                    cervical os

 Comment:    Dividing membrane not definitely seen.  Technically
             dificult due to decreased AF around both fetuses (bulging
             of membranes into vagina.)  Inhomogeneous area
             anteriorly adjacent to placental edge suspicous for
             placental hemorrhage/separation.

 Amniotic Fluid
 AFI FV:      Subjectively decreased
                                             Larg Pckt:     3.8  cm
Biometry (Fetus A)

 BPD:     41.4  mm    G. Age:   18w 4d
                                                      FL/BPD:
 FL:      27.6  mm    G. Age:   18w 3d       50  %
Gestational Age (Fetus A)

 Clinical EDD:  18w 2d                                        EDD:   04/11/12
 U/S Today:     18w 4d                                        EDD:   04/09/12
 Best:          18w 2d    Det. By:   Clinical EDD             EDD:   04/11/12
Fetal Evaluation (Fetus B)

 Cardiac Activity:  Absent
 Fetal Lie:         Maternal right side
 Presentation:      Breech
 Placenta:          Posterior Fundal, above
                    cervical os

 Comment:    Dividing membrane not definitely seen.  Technically
             dificult due to decreased AF around both fetuses (bulging
             of membranes into vagina.)  Inhomogeneous area
             anteriorly adjacent to placental edge suspicous for
             placental hemorrhage/separation.

 Amniotic Fluid
 AFI FV:      Subjectively decreased
                                             Larg Pckt:     3.8  cm
Biometry (Fetus B)

 BPD:     40.6  mm    G. Age:   18w 2d
                                                      FL/BPD:     67.7   N/A
 FL:      27.5  mm    G. Age:   18w 3d       49  %
Gestational Age (Fetus B)

 Clinical EDD:  18w 2d                                        EDD:   04/11/12
 U/S Today:     18w 3d                                        EDD:   04/10/12
 Best:          18w 2d    Det. By:   Clinical EDD             EDD:   04/11/12
Cervix Uterus Adnexa

 Cervix:       Hourglass membranes into the vagina.
 Uterus:       No abnormality visualized.

 Left Ovary:   Not visualized.
 Right Ovary:  Not visualized.
Comments

 This study was performed as an on call procedure and was
 initially reviewed by Dr. Meeka Hamidi.
 Dr. Ceejay was present for this exam and exam was
 abbreviated at his request due to expected imminent delivery.
Recommendations

 Demised twin B.

 Twin A with EGA by US of 18w 4d, correlating with expected
 EGA by clinical EDD of 18w 2d. Fetal bradycardia with a rate
 of 88 bpm.

 Subjectively decreased amniotic fluid around both twins.

 Buldging membranes into the vaginal vault with twin A feet in
 the through the endocervical canal.

 Inhomogeneous area at the inferior margin of twin A placenta
 measuring 6.6 x 1.8 cm suspicious for marginal abruption.
 questions or concerns.

## 2014-08-12 ENCOUNTER — Encounter: Payer: Self-pay | Admitting: Physician Assistant

## 2021-06-23 ENCOUNTER — Other Ambulatory Visit (HOSPITAL_COMMUNITY)
Admission: RE | Admit: 2021-06-23 | Discharge: 2021-06-23 | Disposition: A | Discharge status: Home / Self Care | Payer: Medicaid Other | Source: Ambulatory Visit | Attending: Nurse Practitioner | Admitting: Nurse Practitioner

## 2021-06-23 DIAGNOSIS — R87612 Low grade squamous intraepithelial lesion on cytologic smear of cervix (LGSIL): Secondary | ICD-10-CM | POA: Insufficient documentation

## 2021-06-26 LAB — SURGICAL PATHOLOGY

## 2021-07-13 ENCOUNTER — Encounter: Payer: Medicaid Other | Admitting: Obstetrics & Gynecology

## 2021-08-04 ENCOUNTER — Encounter: Payer: Medicaid Other | Admitting: Obstetrics & Gynecology

## 2021-08-20 ENCOUNTER — Encounter: Payer: Self-pay | Admitting: Obstetrics & Gynecology

## 2021-08-20 ENCOUNTER — Telehealth: Payer: Self-pay | Admitting: Obstetrics & Gynecology

## 2021-08-20 NOTE — Telephone Encounter (Signed)
Unable to reach pt by phone to reschedule missed LEEP consult Sent certified letter requesting pt to call us to reschedule & update contact phone number

## 2021-09-22 ENCOUNTER — Encounter: Payer: Medicaid Other | Admitting: Obstetrics & Gynecology
